# Patient Record
Sex: Male | Born: 2000 | Race: White | Hispanic: No | Marital: Single
Health system: Southern US, Community
[De-identification: ages and names within clinical notes are randomized; demographics above are authoritative.]

---
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
## 2019-04-19 ENCOUNTER — Other Ambulatory Visit: Payer: Self-pay | Admitting: Family Medicine

## 2019-04-19 DIAGNOSIS — N50819 Testicular pain, unspecified: Secondary | ICD-10-CM

## 2019-04-23 ENCOUNTER — Ambulatory Visit: Payer: 59

## 2019-04-26 ENCOUNTER — Other Ambulatory Visit: Payer: Self-pay

## 2019-04-26 ENCOUNTER — Ambulatory Visit
Admission: RE | Admit: 2019-04-26 | Discharge: 2019-04-26 | Disposition: A | Discharge status: Home / Self Care | Payer: 59 | Source: Ambulatory Visit | Attending: Family Medicine | Admitting: Family Medicine

## 2019-04-26 DIAGNOSIS — N50819 Testicular pain, unspecified: Secondary | ICD-10-CM

## 2020-11-02 IMAGING — US US SCROTUM W/ DOPPLER COMPLETE
1 series · 14 of 25 positions shown · non-contrast
Comparison: None.

CLINICAL DATA: Left testicle pain

EXAM:
SCROTAL ULTRASOUND
DOPPLER ULTRASOUND OF THE TESTICLES
TECHNIQUE: Complete ultrasound examination of the testicles, epididymis, and
other scrotal structures was performed. Color and spectral Doppler
ultrasound were also utilized to evaluate blood flow to the
testicles.

[Series 1: us scrotum w/ doppler complete · 0.07mm/px · 14 of 57 slices shown]
[im 1/57]
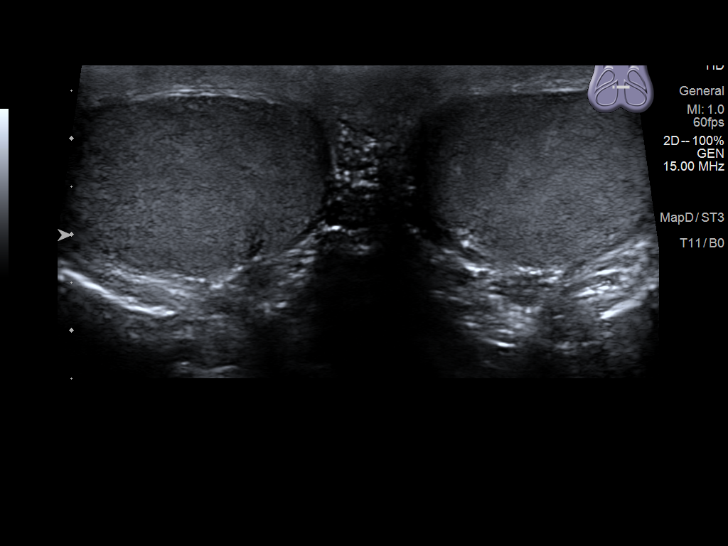
[im 5/57]
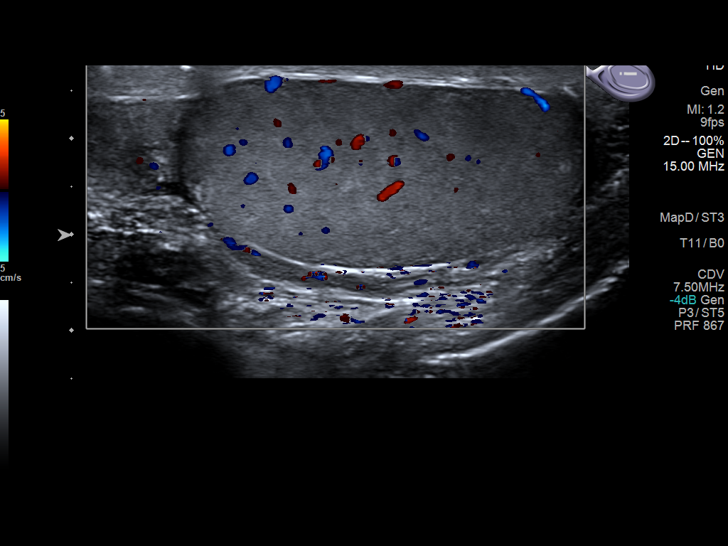
[im 10/57]
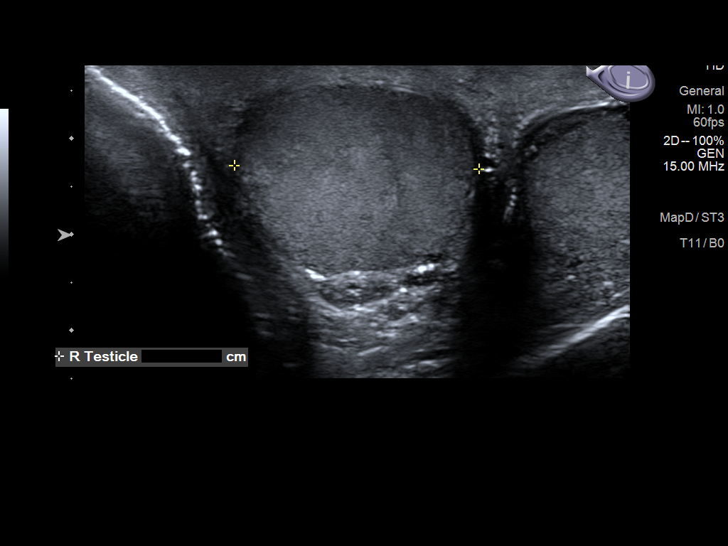
[im 15/57]
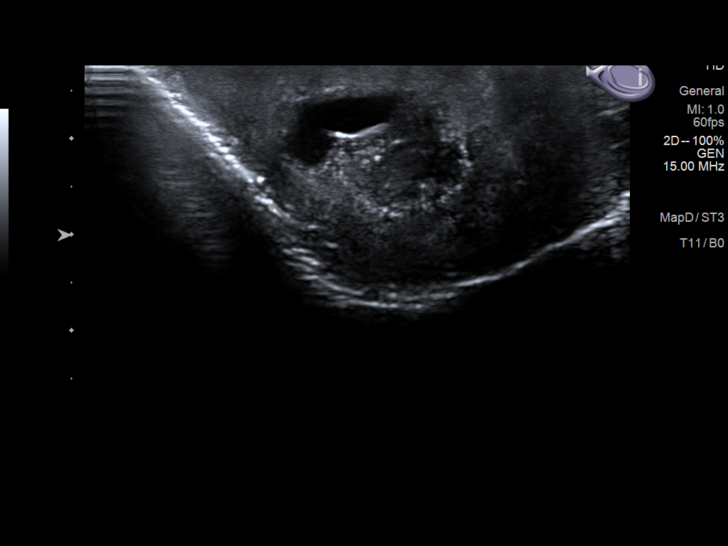
[im 19/57]
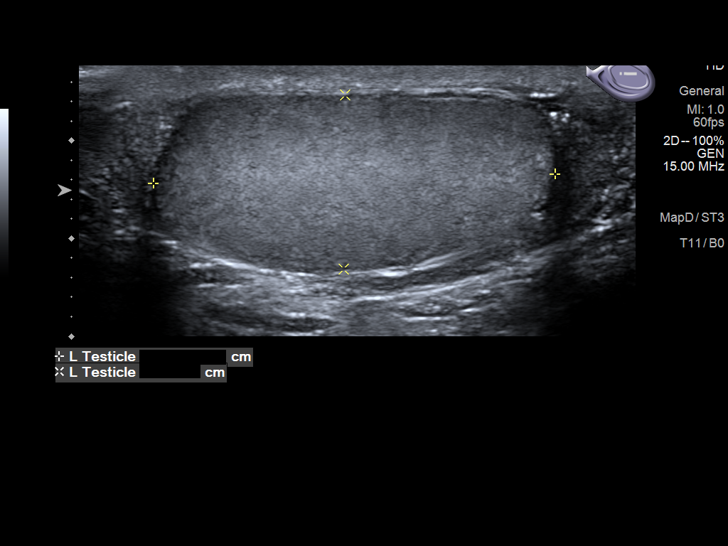
[im 22/57]
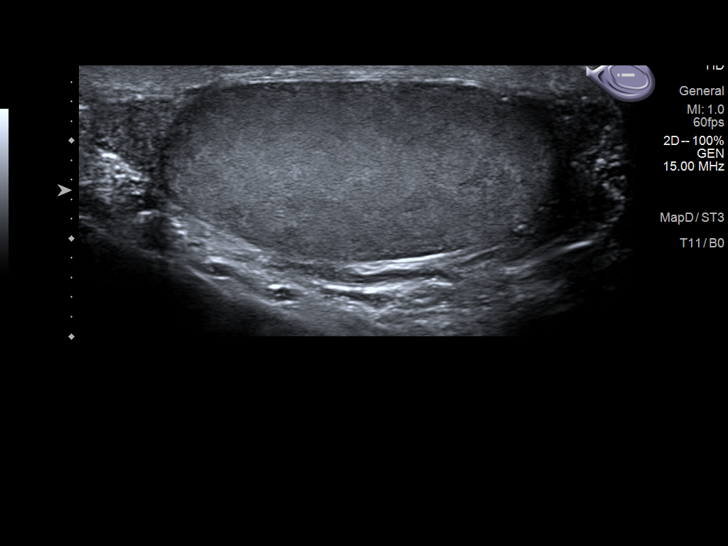
[im 26/57]
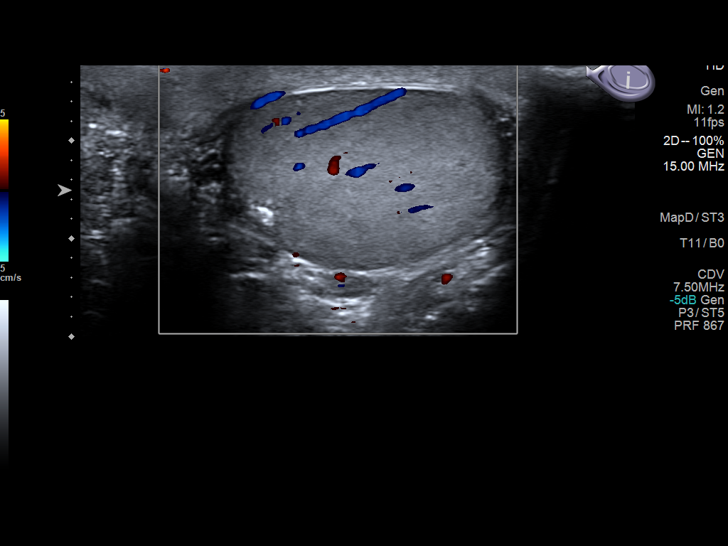
[im 31/57]
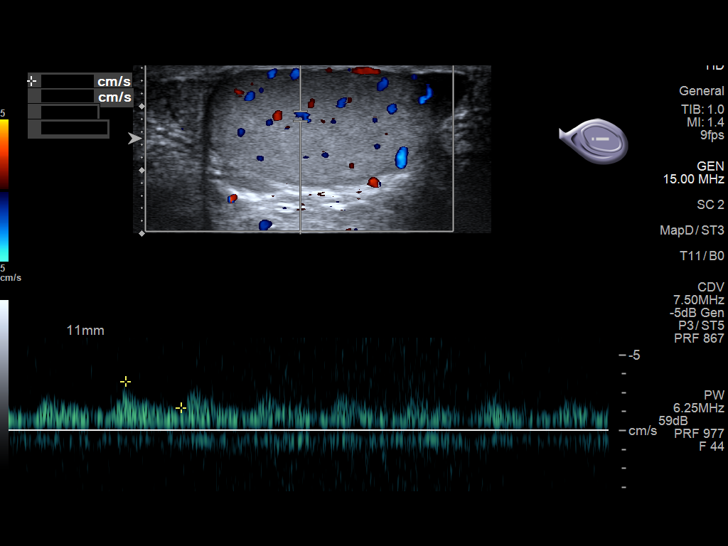
[im 36/57]
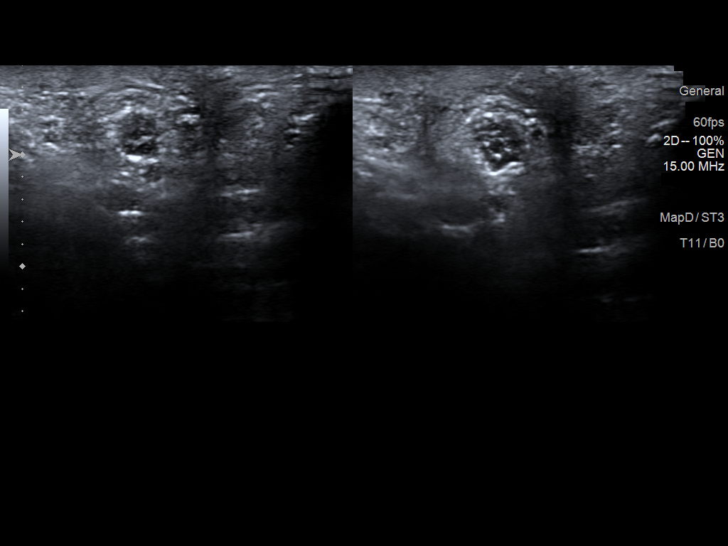
[im 38/57]
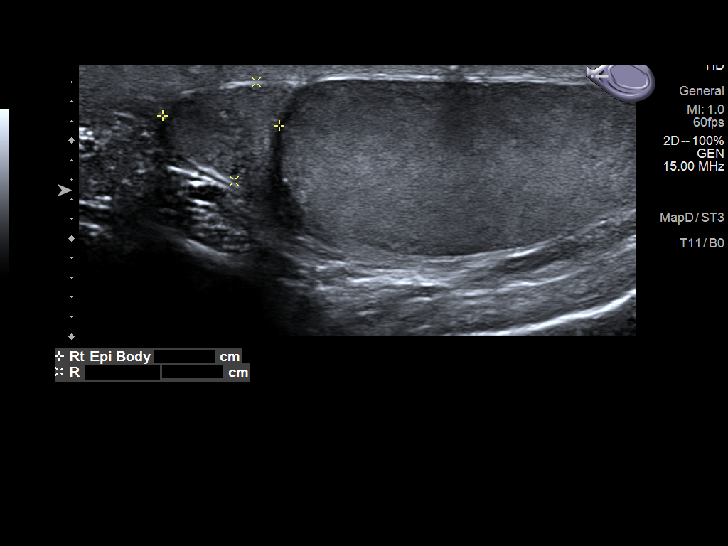
[im 43/57]
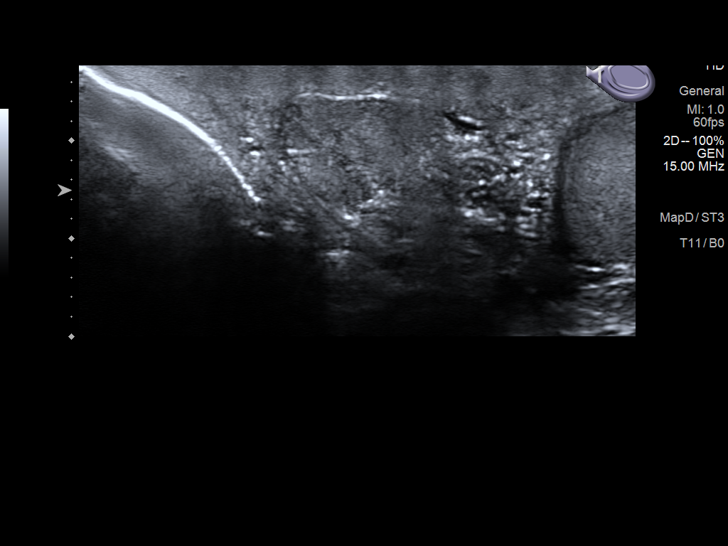
[im 47/57]
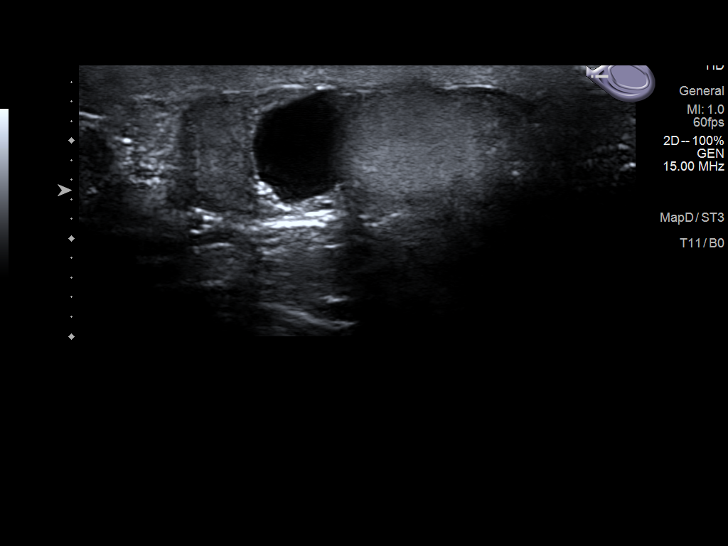
[im 52/57]
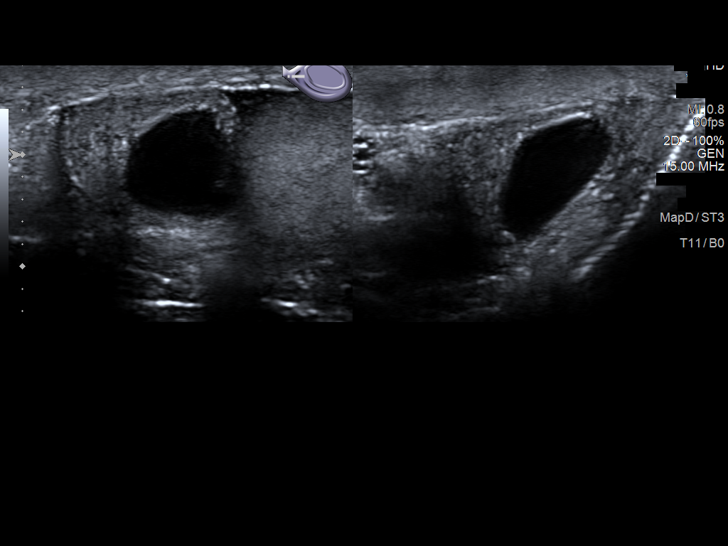
[im 57/57]
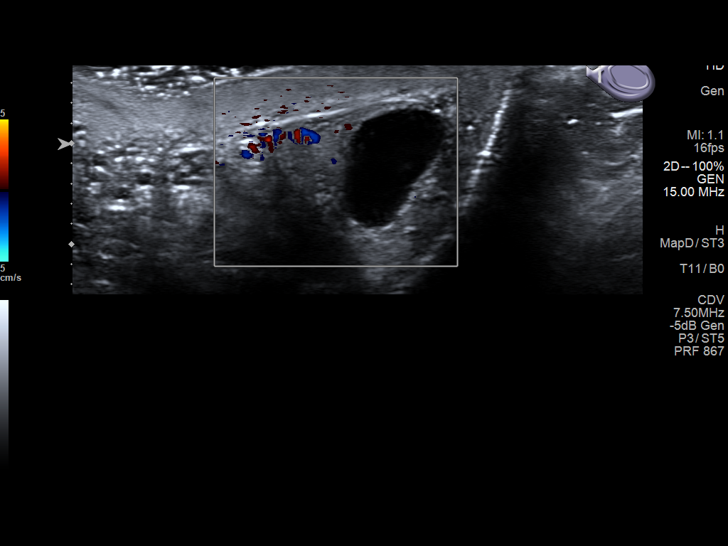

[14 of 25 positions shown; findings below may reference images not displayed]

FINDINGS: Right testicle

Measurements: 4.3 x 1.9 x 2.6 cm. No mass or microlithiasis
visualized.

Left testicle

Measurements: 4.1 x 1.8 x 2.9 cm. No mass or microlithiasis
visualized.

Right epididymis:  Small cyst measuring 3 mm

Left epididymis:  Cyst measuring 1.1 cm

Hydrocele:  None visualized.

Varicocele:  None visualized.

Pulsed Doppler interrogation of both testes demonstrates normal low
resistance arterial and venous waveforms bilaterally.
IMPRESSION: 1. Negative for testicular torsion or testicular mass lesion
2. Left greater than right epididymal cysts
